# Patient Record
Sex: Male | Born: 1955 | Race: White | Hispanic: No | Marital: Married | Smoking: Never smoker
Health system: Southern US, Community
[De-identification: ages and names within clinical notes are randomized; demographics above are authoritative.]

## PROBLEM LIST (undated history)

## (undated) DIAGNOSIS — E7800 Pure hypercholesterolemia, unspecified: Secondary | ICD-10-CM

## (undated) DIAGNOSIS — Z9889 Other specified postprocedural states: Secondary | ICD-10-CM

## (undated) DIAGNOSIS — R112 Nausea with vomiting, unspecified: Secondary | ICD-10-CM

## (undated) HISTORY — PX: HERNIA REPAIR: SHX51

## (undated) HISTORY — PX: HYDROCELE EXCISION / REPAIR: SUR1145

---

## 1999-02-12 ENCOUNTER — Ambulatory Visit (HOSPITAL_BASED_OUTPATIENT_CLINIC_OR_DEPARTMENT_OTHER): Admission: RE | Admit: 1999-02-12 | Discharge: 1999-02-12 | Payer: Self-pay | Admitting: Urology

## 2004-02-03 ENCOUNTER — Ambulatory Visit (HOSPITAL_COMMUNITY): Admission: RE | Admit: 2004-02-03 | Discharge: 2004-02-03 | Payer: Self-pay | Admitting: Gastroenterology

## 2005-03-21 ENCOUNTER — Ambulatory Visit: Payer: Self-pay | Admitting: Family Medicine

## 2005-07-14 ENCOUNTER — Ambulatory Visit: Payer: Self-pay | Admitting: Family Medicine

## 2005-08-03 ENCOUNTER — Ambulatory Visit: Payer: Self-pay | Admitting: Family Medicine

## 2007-01-02 ENCOUNTER — Ambulatory Visit: Payer: Self-pay | Admitting: Family Medicine

## 2008-02-10 ENCOUNTER — Emergency Department (HOSPITAL_COMMUNITY): Admission: EM | Admit: 2008-02-10 | Discharge: 2008-02-10 | Payer: Self-pay | Admitting: Emergency Medicine

## 2008-02-10 IMAGING — CR DG FOOT COMPLETE 3+V*R*
3 series · 3 of 3 positions shown · non-contrast
Comparison: None available

CLINICAL DATA: *Trauma (Blunt);

RIGHT FOOT COMPLETE - 3+ VIEW

[view not recorded (1 of 3)]
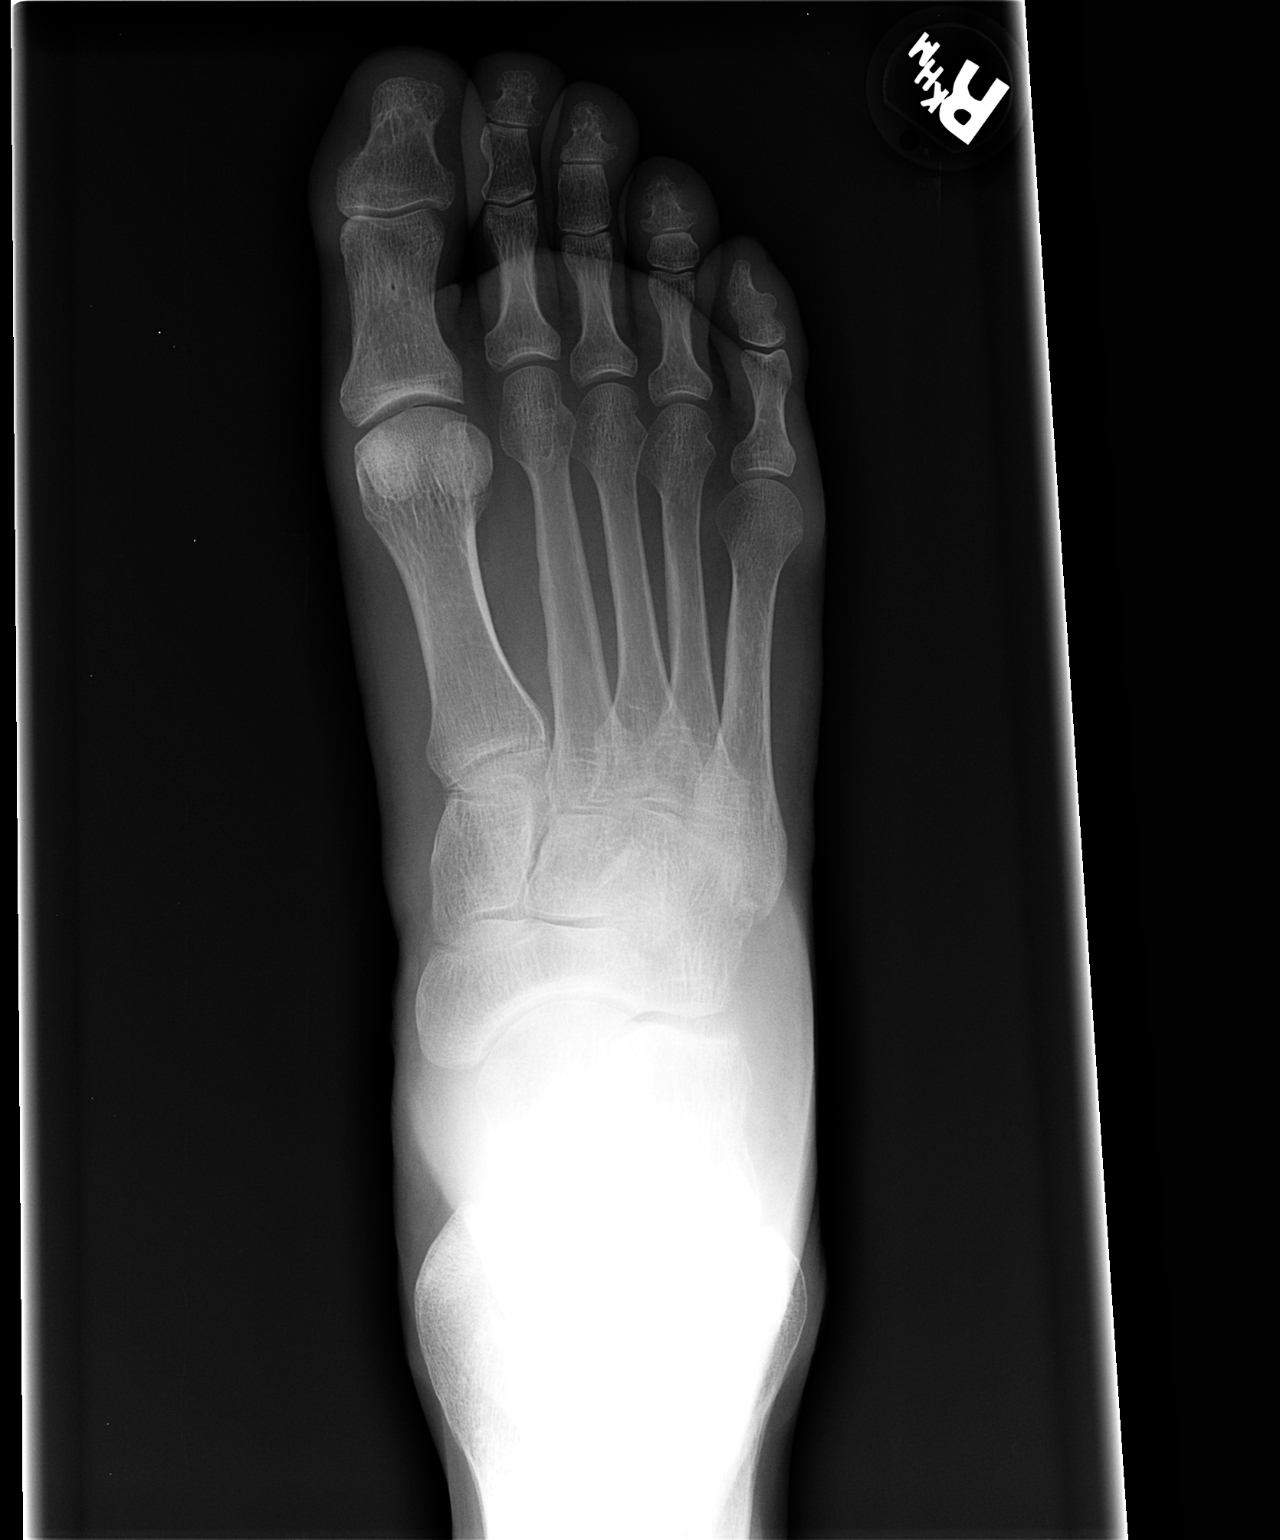

[view not recorded (2 of 3)]
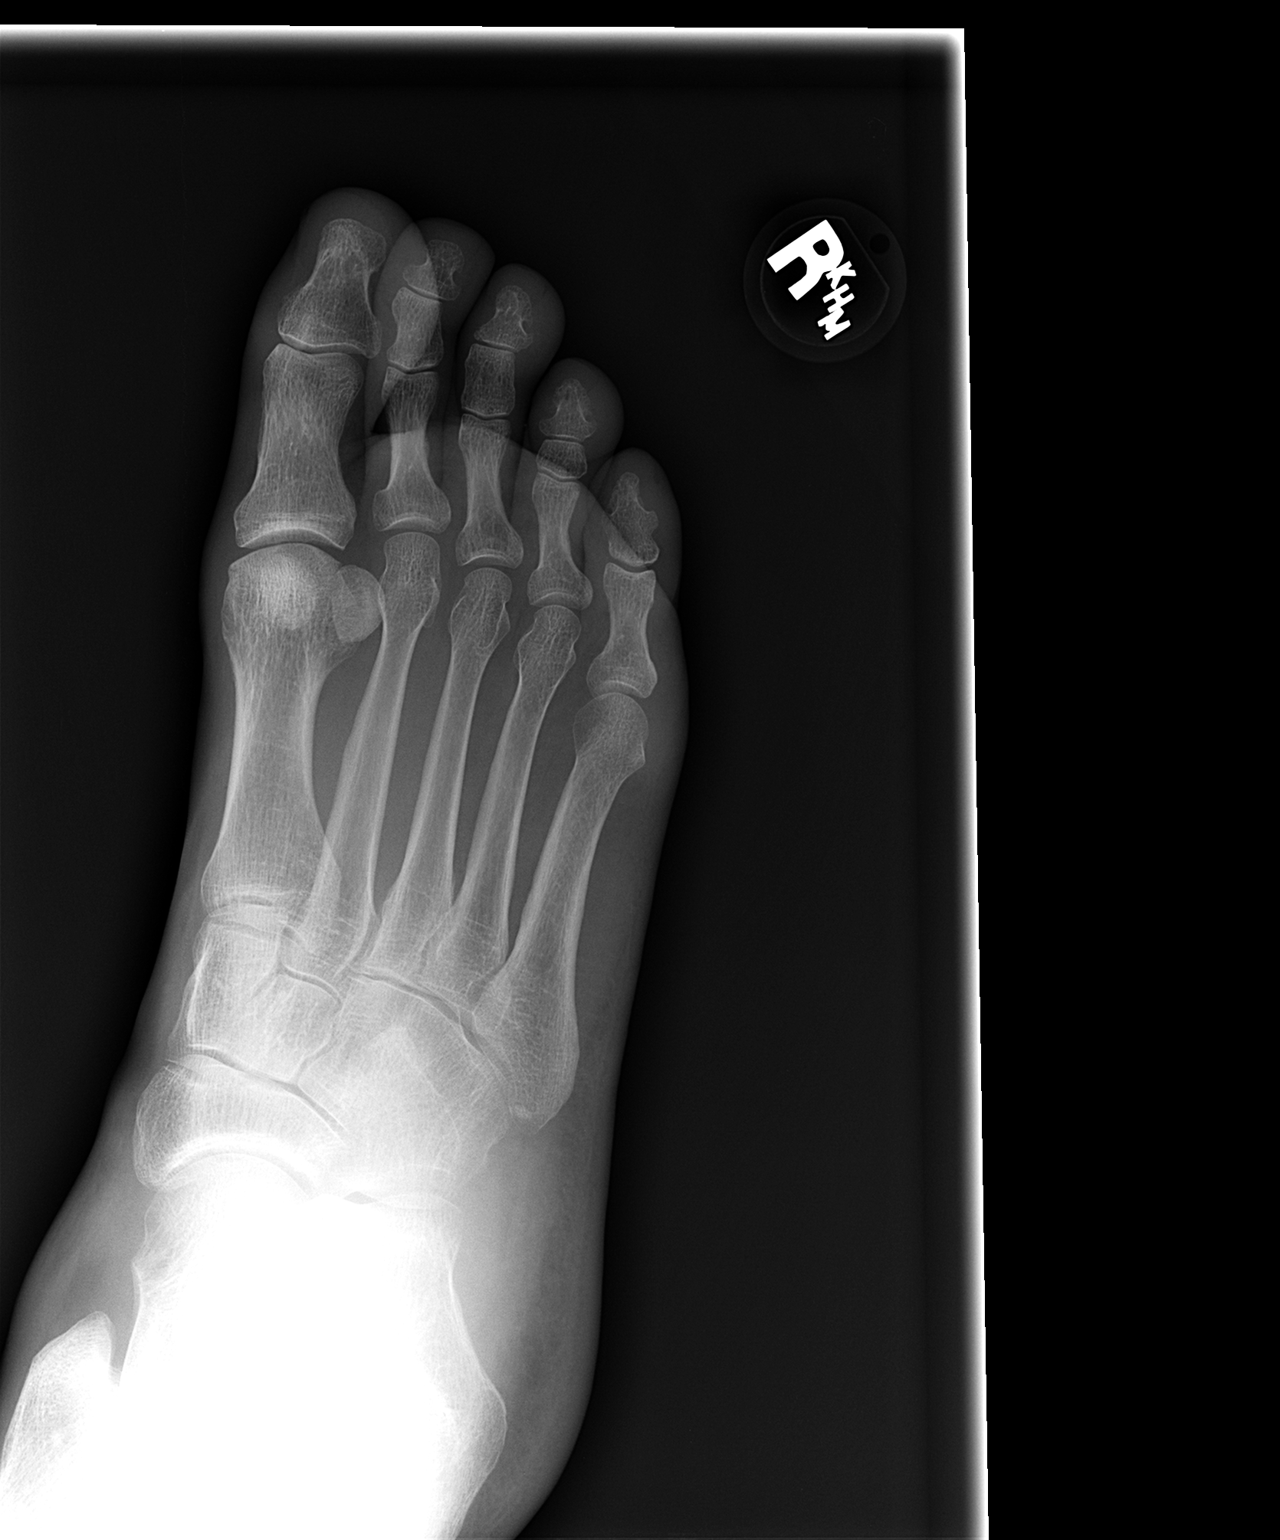

[view not recorded (3 of 3)]
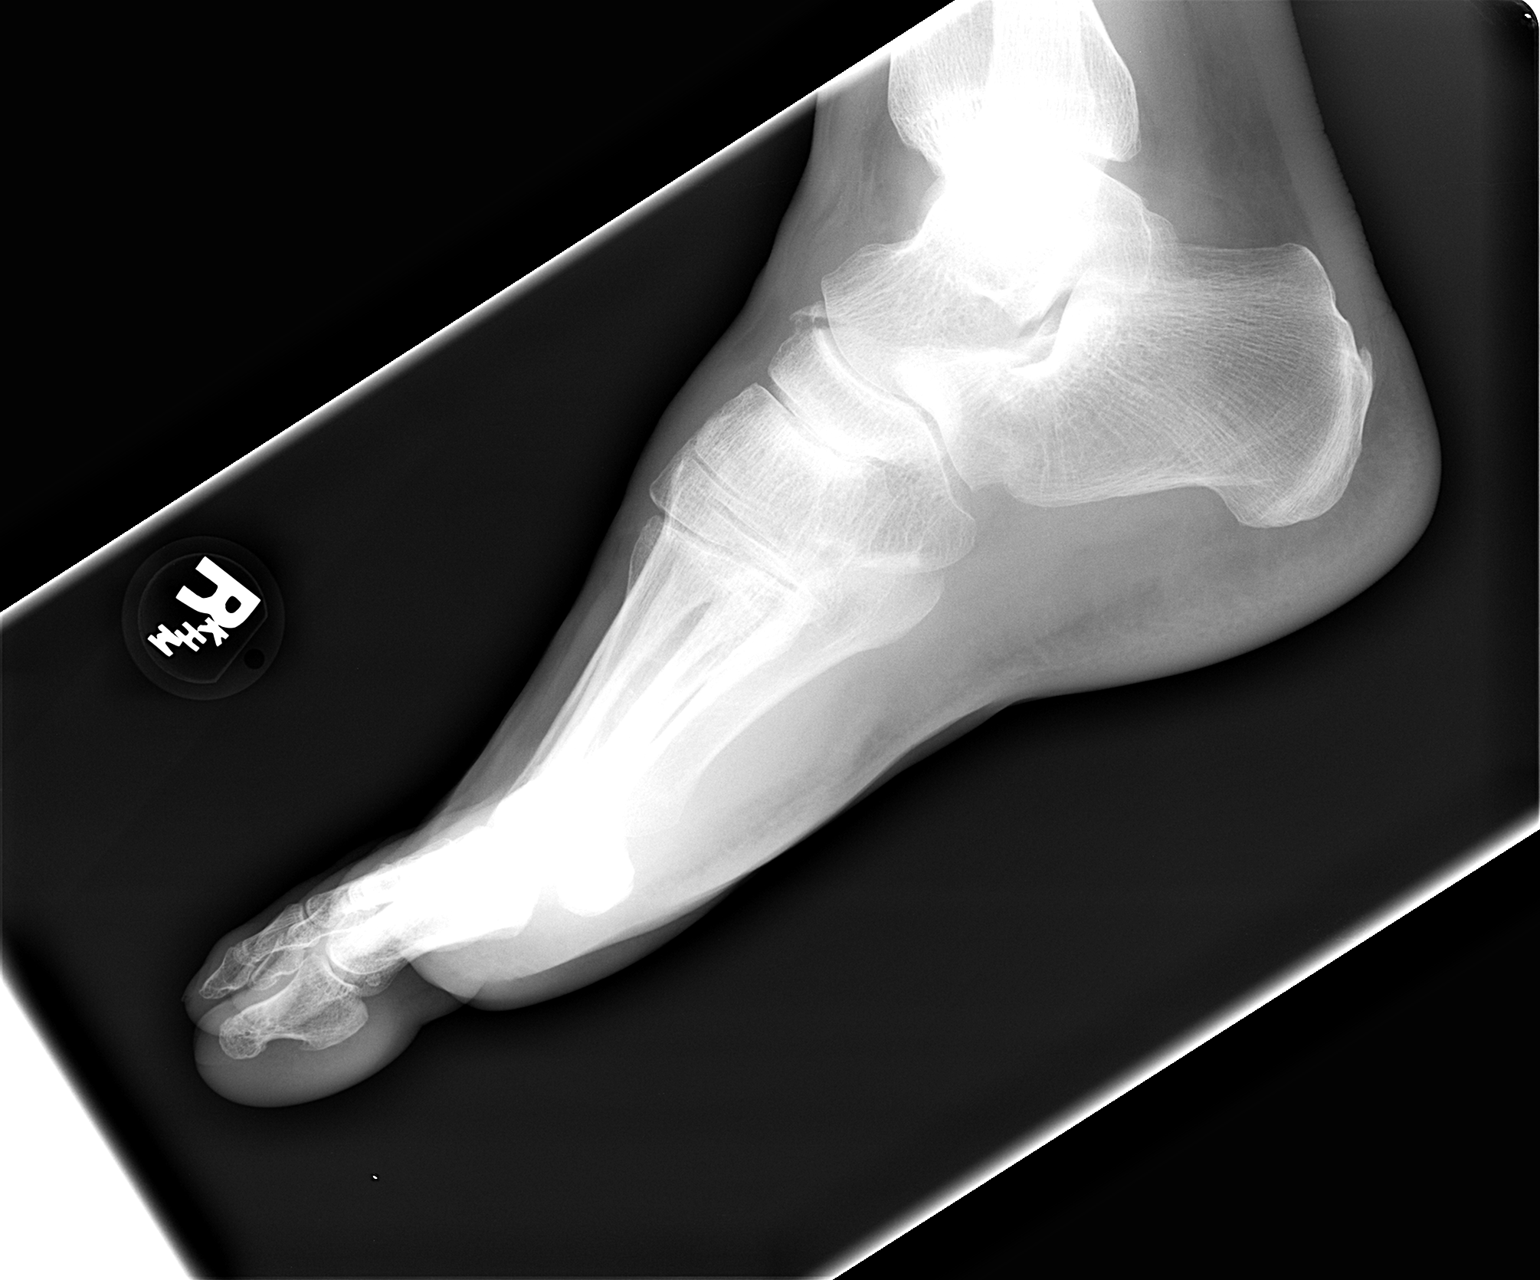

[3 of 3 positions shown; findings below may reference images not displayed]

FINDINGS: Small calcaneal spur.  There is fracture of the dorsal
margin of the navicular, small fracture fragment 2-3 mm.  Normal
alignment and mineralization.
IMPRESSION: 1.  Minimal displaced fracture, dorsal aspect navicular

## 2011-04-08 NOTE — Op Note (Signed)
NAME:  Jeremy Murphy, Jeremy Murphy                          ACCOUNT NO.:  [id number]   MEDICAL RECORD NO.:  [id number]                   PATIENT TYPE:  AMB   LOCATION:  ENDO                                 FACILITY:  [facility]   PHYSICIAN:  James L. Malon Kindle., M.D.          DATE OF BIRTH:  18-Apr-1956   DATE OF PROCEDURE:  02/03/2004  DATE OF DISCHARGE:                                 OPERATIVE REPORT   PROCEDURE PERFORMED:  Esophagogastroduodenoscopy with Savary dilatation.   MEDICATIONS:  Fentanyl 75 mcg, Versed 5 mg IV.   ENDOSCOPIST:  Llana Aliment. Randa Evens, M.D.   INDICATIONS FOR PROCEDURE:  Dysphagia due to Schatzki's ring.   DESCRIPTION OF PROCEDURE:  The procedure had been explained to the patient  in detail in the office and consent obtained.  In the endoscopy unit with  the patient in left lateral decubitus position and sedated, the endoscope  was passed.  The portable fluoroscopy unit was used.  The esophagus was  entered.  There was, in fact, stricture or ring in the distal esophagus we  easily passed.  There was Murphy small hiatal hernia.  Complete endoscopy was  performed.  The gastric outlet was identified and passed.  The duodenum  including the bulb and second portion were seen well and were unremarkable.  The scope was withdrawn back into the stomach. The pyloric channel was  normal.  The antrum and body were normal.  Fundus and cardia seen well on  retroflex view and were normal.  We then passed Murphy Savary guidewire through  the scope and using fluoroscopic guidance withdrew the scope over the  guidewire taking care to keep the guidewire along the greater curve of the  stomach.  With the patient's neck extended, we passed Murphy 42, 45 and 48 French  dilator.  There was no heme with the 42 and 45 [place] dilators, Murphy small  amount of heme with Murphy 48 [place] dilator.  The dilator and guidewire were  then withdrawn.  The patient tolerated the procedure well and there were no  immediate complications.   He was monitored and given supplemental oxygen.   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   PLAN:  Routine postdilation instructions with clear liquids for four hours.  Call for shortness of breath, pain, bleeding, etc with gradual return to Murphy  regular diet.  The patient will come back and see [place] as needed and if needed  we can arrange further dilatation.                                               James L. Malon Kindle., M.D.    Waldron Session  D:  02/03/2004  T:  02/04/2004  Job:  [id number]   cc:   Delaney Meigs, M.D.  [address]  [place]  [place] [address]  Fax: [phone]

## 2012-03-07 ENCOUNTER — Encounter (HOSPITAL_COMMUNITY): Payer: Self-pay | Admitting: *Deleted

## 2012-03-07 ENCOUNTER — Ambulatory Visit (HOSPITAL_COMMUNITY): Payer: PRIVATE HEALTH INSURANCE

## 2012-03-07 ENCOUNTER — Encounter (HOSPITAL_COMMUNITY)
Admission: RE | Disposition: A | Discharge status: Home / Self Care | Payer: Self-pay | Source: Ambulatory Visit | Attending: Gastroenterology

## 2012-03-07 ENCOUNTER — Ambulatory Visit (HOSPITAL_COMMUNITY)
Admission: RE | Admit: 2012-03-07 | Discharge: 2012-03-07 | Disposition: A | Discharge status: Home / Self Care | Payer: PRIVATE HEALTH INSURANCE | Source: Ambulatory Visit | Attending: Gastroenterology | Admitting: Gastroenterology

## 2012-03-07 DIAGNOSIS — K222 Esophageal obstruction: Secondary | ICD-10-CM | POA: Insufficient documentation

## 2012-03-07 HISTORY — DX: Pure hypercholesterolemia, unspecified: E78.00

## 2012-03-07 HISTORY — PX: SAVORY DILATION: SHX5439

## 2012-03-07 SURGERY — ESOPHAGOGASTRODUODENOSCOPY (EGD) WITH ESOPHAGEAL DILATION
Anesthesia: Moderate Sedation

## 2012-03-07 MED ORDER — SODIUM CHLORIDE 0.9 % IV SOLN
Freq: Once | INTRAVENOUS | Status: AC
  Administered 2012-03-07: 11:00:00 via INTRAVENOUS

## 2012-03-07 MED ORDER — MIDAZOLAM HCL 10 MG/2ML IJ SOLN
INTRAMUSCULAR | Status: AC
  Filled 2012-03-07: qty 4

## 2012-03-07 MED ORDER — MIDAZOLAM HCL 10 MG/2ML IJ SOLN
INTRAMUSCULAR | Status: DC | PRN
  Administered 2012-03-07 (×4): 2 mg via INTRAVENOUS

## 2012-03-07 MED ORDER — FENTANYL CITRATE 0.05 MG/ML IJ SOLN
INTRAMUSCULAR | Status: AC
  Filled 2012-03-07: qty 4

## 2012-03-07 MED ORDER — FENTANYL CITRATE 0.05 MG/ML IJ SOLN
INTRAMUSCULAR | Status: DC | PRN
  Administered 2012-03-07 (×3): 25 ug via INTRAVENOUS

## 2012-03-07 MED ORDER — BUTAMBEN-TETRACAINE-BENZOCAINE 2-2-14 % EX AERO
INHALATION_SPRAY | CUTANEOUS | Status: DC | PRN
  Administered 2012-03-07: 2 via TOPICAL

## 2012-03-07 MED ORDER — DIPHENHYDRAMINE HCL 50 MG/ML IJ SOLN
INTRAMUSCULAR | Status: AC
  Filled 2012-03-07: qty 1

## 2012-03-07 NOTE — Discharge Instructions (Addendum)
Continue current meds.  Clear liquids for 4-6 hours, if no chest pain or trouble breathing, soft foods tonight.  Regular diet tomorrow. Call for problems. Continue to take OTC omeprazole. Call Dr Randa Evens office with swallowing report next week.  Endoscopy with Esophageal Dilitation Care After Please read the instructions outlined below and refer to this sheet in the next few weeks. These discharge instructions provide you with general information on caring for yourself after you leave the hospital. Your doctor may also give you specific instructions. While your treatment has been planned according to the most current medical practices available, unavoidable complications occasionally occur. If you have any problems or questions after discharge, please call your doctor.  HOME CARE INSTRUCTIONS Activity  You may resume your regular activity but move at a slower pace for the next 24 hours.   Take frequent rest periods for the next 24 hours.   Walking will help expel (get rid of) the air and reduce the bloated feeling in your abdomen.   No driving for 24 hours (because of the anesthesia (medicine) used during the test).   You may shower.   Do not sign any important legal documents or operate any machinery for 24 hours (because of the anesthesia used during the test).    Nutrition  Clear Liquids for 4-6 hours today,then if no swallowing difficulty you may have soft foods tonight.   Regular diet tomorrow.  Avoid alcoholic beverages for 24 hours.   Medications You may resume your normal medications unless your caregiver tells you otherwise.  What you can expect today  You may experience abdominal discomfort such as a feeling of fullness or "gas" pains.   You may experience a sore throat for 2 to 3 days. This is normal. Gargling with salt water may help this.   SEEK IMMEDIATE MEDICAL CARE IF:  You have excessive nausea (feeling sick to your stomach) and/or vomiting.   You have  severe abdominal pain and distention (swelling).   You have trouble swallowing.   You have a temperature over 100 F (37.8 C).   You have rectal bleeding or vomiting of blood.   Difficulty swallowing that is worse than normal.

## 2012-03-07 NOTE — Op Note (Signed)
[facility] [address] [place], [place]  [address]  ENDOSCOPY PROCEDURE REPORT  PATIENT:  Jeremy Murphy, Jeremy Murphy  MR#:  [id number] BIRTHDATE:  Apr 05, 1956, 55 yrs. old  GENDER:  male  ENDOSCOPIST:  Carman Ching ASSISTANT:  Angelique Blonder and Windell Hummingbird, Technician Referred by:  Joette Catching, M.D.  PROCEDURE DATE:  03/07/2012 PROCEDURE:  Esophagoscopy with Savary Dilation of Esophagus ASA CLASS:  Class I INDICATIONS:  1) dysphagia  2) stricture  MEDICATIONS:   Fentanyl 50 mcg IV, Versed 8 mg IV TOPICAL ANESTHETIC:  Cetacaine Spray  DESCRIPTION OF PROCEDURE:   After the risks benefits and alternatives of the procedure were thoroughly explained, informed consent was obtained.  The EG-[id number] ([id number]) endoscope was introduced through the mouth and advanced to the gastroesophageal junction, limited by a stricture. unable to pass scope with gentle pressure  The instrument was slowly withdrawn as the mucosa was carefully examined. <<PROCEDUREIMAGES>>  A stricture was found in the distal esophagus.    Dilation was then performed at the gastroesphageal junction  1) Dilator:  Savary over guidewire  Size(s): Resistance:  minimal  Heme:  yes Appearance: Past 12.8, 14, and 15 mm dilators using fluoroscopic guidance. Heme seen with a 15 mm dilator only. Minimal resistance.  COMPLICATIONS:  A complication of none occurred on 03/07/2012 at. no immediate complications  ENDOSCOPIC IMPRESSION: 1) Stricture in the distal esophagus dilated to 15 mm RECOMMENDATIONS: routine post dilatation orders. We will keep him on same medications. We will have him call us to report next week and do further dilatation if needed.  REPEAT EXAM:  No  ______________________________ Carman Ching  CC:  Joette Catching, MD  n. Rosalie DoctorCarman Ching at 03/07/2012 11:43 AM  Melvia Heaps, [phone]

## 2012-03-07 NOTE — H&P (Signed)
  H+P:  Patient with history of stricture with increasing dysphagia coming in for elective dilatation. H&P from the office reviewed and updated and scanned  Medications/Allergies:  See nurses notes and scanned H+P  PE: See pre-op evaluation

## 2012-03-08 ENCOUNTER — Encounter (HOSPITAL_COMMUNITY): Payer: Self-pay | Admitting: Gastroenterology

## 2013-12-04 DIAGNOSIS — C4492 Squamous cell carcinoma of skin, unspecified: Secondary | ICD-10-CM

## 2013-12-04 HISTORY — DX: Squamous cell carcinoma of skin, unspecified: C44.92

## 2014-03-24 DIAGNOSIS — K219 Gastro-esophageal reflux disease without esophagitis: Secondary | ICD-10-CM | POA: Insufficient documentation

## 2014-03-24 DIAGNOSIS — E782 Mixed hyperlipidemia: Secondary | ICD-10-CM | POA: Insufficient documentation

## 2018-06-12 ENCOUNTER — Other Ambulatory Visit: Payer: Self-pay | Admitting: Gastroenterology

## 2018-06-19 ENCOUNTER — Encounter (HOSPITAL_COMMUNITY): Payer: Self-pay | Admitting: Anesthesiology

## 2018-06-19 NOTE — Anesthesia Preprocedure Evaluation (Addendum)
Anesthesia Evaluation  Patient identified by MRN, date of birth, ID band Patient awake    Reviewed: Allergy & Precautions, H&P , NPO status , Patient's Chart, lab work & pertinent test results  History of Anesthesia Complications (+) PONV and history of anesthetic complications  Airway Mallampati: I  TM Distance: >3 FB Neck ROM: full    Dental no notable dental hx. (+) Teeth Intact   Pulmonary neg pulmonary ROS,    breath sounds clear to auscultation       Cardiovascular negative cardio ROS Normal cardiovascular exam Rhythm:regular Rate:Normal     Neuro/Psych negative neurological ROS  negative psych ROS   GI/Hepatic negative GI ROS, Neg liver ROS,   Endo/Other  negative endocrine ROS  Renal/GU negative Renal ROS  negative genitourinary   Musculoskeletal negative musculoskeletal ROS (+)   Abdominal Normal abdominal exam  (+)   Peds  Hematology negative hematology ROS (+)   Anesthesia Other Findings   Reproductive/Obstetrics negative OB ROS                            Anesthesia Physical Anesthesia Plan  ASA: II  Anesthesia Plan: MAC   Post-op Pain Management:    Induction:   PONV Risk Score and Plan: 2 and Dexamethasone and Scopolamine patch - Pre-op  Airway Management Planned: Natural Airway and Mask  Additional Equipment:   Intra-op Plan:   Post-operative Plan:   Informed Consent: I have reviewed the patients History and Physical, chart, labs and discussed the procedure including the risks, benefits and alternatives for the proposed anesthesia with the patient or authorized representative who has indicated his/her understanding and acceptance.   Dental Advisory Given  Plan Discussed with: CRNA  Anesthesia Plan Comments:        Anesthesia Quick Evaluation

## 2018-06-20 ENCOUNTER — Ambulatory Visit (HOSPITAL_COMMUNITY): Payer: BLUE CROSS/BLUE SHIELD

## 2018-06-20 ENCOUNTER — Encounter (HOSPITAL_COMMUNITY)
Admission: RE | Disposition: A | Discharge status: Home / Self Care | Payer: Self-pay | Source: Ambulatory Visit | Attending: Gastroenterology

## 2018-06-20 ENCOUNTER — Ambulatory Visit (HOSPITAL_COMMUNITY): Payer: BLUE CROSS/BLUE SHIELD | Admitting: Anesthesiology

## 2018-06-20 ENCOUNTER — Other Ambulatory Visit: Payer: Self-pay

## 2018-06-20 ENCOUNTER — Encounter (HOSPITAL_COMMUNITY): Payer: Self-pay | Admitting: Anesthesiology

## 2018-06-20 ENCOUNTER — Ambulatory Visit (HOSPITAL_COMMUNITY)
Admission: RE | Admit: 2018-06-20 | Discharge: 2018-06-20 | Disposition: A | Discharge status: Home / Self Care | Payer: BLUE CROSS/BLUE SHIELD | Source: Ambulatory Visit | Attending: Gastroenterology | Admitting: Gastroenterology

## 2018-06-20 DIAGNOSIS — R1310 Dysphagia, unspecified: Secondary | ICD-10-CM | POA: Insufficient documentation

## 2018-06-20 DIAGNOSIS — K222 Esophageal obstruction: Secondary | ICD-10-CM | POA: Insufficient documentation

## 2018-06-20 DIAGNOSIS — Z9889 Other specified postprocedural states: Secondary | ICD-10-CM | POA: Insufficient documentation

## 2018-06-20 DIAGNOSIS — Z7982 Long term (current) use of aspirin: Secondary | ICD-10-CM | POA: Diagnosis not present

## 2018-06-20 HISTORY — DX: Other specified postprocedural states: Z98.890

## 2018-06-20 HISTORY — PX: ESOPHAGOGASTRODUODENOSCOPY (EGD) WITH PROPOFOL: SHX5813

## 2018-06-20 HISTORY — DX: Nausea with vomiting, unspecified: R11.2

## 2018-06-20 HISTORY — PX: SAVORY DILATION: SHX5439

## 2018-06-20 SURGERY — ESOPHAGOGASTRODUODENOSCOPY (EGD) WITH PROPOFOL
Anesthesia: Monitor Anesthesia Care

## 2018-06-20 MED ORDER — LACTATED RINGERS IV SOLN
INTRAVENOUS | Status: DC
  Administered 2018-06-20: 1000 mL via INTRAVENOUS

## 2018-06-20 MED ORDER — SCOPOLAMINE 1 MG/3DAYS TD PT72
MEDICATED_PATCH | TRANSDERMAL | Status: DC | PRN
  Administered 2018-06-20: 1 via TRANSDERMAL

## 2018-06-20 MED ORDER — ONDANSETRON HCL 4 MG/2ML IJ SOLN
INTRAMUSCULAR | Status: DC | PRN
  Administered 2018-06-20: 4 mg via INTRAVENOUS

## 2018-06-20 MED ORDER — SODIUM CHLORIDE 0.9 % IV SOLN: INTRAVENOUS | Status: DC

## 2018-06-20 MED ORDER — MIDAZOLAM HCL 5 MG/5ML IJ SOLN
INTRAMUSCULAR | Status: DC | PRN
  Administered 2018-06-20: 2 mg via INTRAVENOUS

## 2018-06-20 MED ORDER — SCOPOLAMINE 1 MG/3DAYS TD PT72
MEDICATED_PATCH | TRANSDERMAL | Status: AC
  Filled 2018-06-20: qty 1

## 2018-06-20 MED ORDER — PROPOFOL 500 MG/50ML IV EMUL
INTRAVENOUS | Status: DC | PRN
  Administered 2018-06-20: 125 ug/kg/min via INTRAVENOUS

## 2018-06-20 MED ORDER — DEXAMETHASONE SODIUM PHOSPHATE 10 MG/ML IJ SOLN
INTRAMUSCULAR | Status: DC | PRN
  Administered 2018-06-20: 10 mg via INTRAVENOUS

## 2018-06-20 MED ORDER — PROPOFOL 500 MG/50ML IV EMUL
INTRAVENOUS | Status: DC | PRN
  Administered 2018-06-20: 50 mg via INTRAVENOUS

## 2018-06-20 MED ORDER — MIDAZOLAM HCL 2 MG/2ML IJ SOLN
INTRAMUSCULAR | Status: AC
  Filled 2018-06-20: qty 2

## 2018-06-20 SURGICAL SUPPLY — 15 items

## 2018-06-20 NOTE — Discharge Instructions (Signed)
YOU HAD AN ENDOSCOPIC PROCEDURE TODAY: Refer to the procedure report and other information in the discharge instructions given to you for any specific questions about what was found during the examination. If this information does not answer your questions, please call [facility] office at [phone] to clarify.  ° °YOU SHOULD EXPECT: Some feelings of bloating in the abdomen. Passage of more gas than usual. Walking can help get rid of the air that was put into your GI tract during the procedure and reduce the bloating.  Some abdominal soreness may be present for a day or two, also. ° °DIET: Your first meal following the procedure should be a light meal and then it is ok to progress to your normal diet. A half-sandwich or bowl of soup is an example of a good first meal. Heavy or fried foods are harder to digest and may make you feel nauseous or bloated. Drink plenty of fluids but you should avoid alcoholic beverages for 24 hours. If you had a esophageal dilation, please see attached instructions for diet.  ° °ACTIVITY: Your care partner should take you home directly after the procedure. You should plan to take it easy, moving slowly for the rest of the day. You can resume normal activity the day after the procedure however YOU SHOULD NOT DRIVE, use power tools, machinery or perform tasks that involve climbing or major physical exertion for 24 hours (because of the sedation medicines used during the test).  ° °SYMPTOMS TO REPORT IMMEDIATELY: °A gastroenterologist can be reached at any hour. Please call [phone]  for any of the following symptoms:  ° °Following upper endoscopy (EGD, EUS, ERCP, esophageal dilation) °Vomiting of blood or coffee ground material  °New, significant abdominal pain  °New, significant chest pain or pain under the shoulder blades  °Painful or persistently difficult swallowing  °New shortness of breath  °Black, tarry-looking or red, bloody stools ° °FOLLOW UP:  °If any biopsies were taken  you will be contacted by phone or by letter within the next 1-3 weeks. Call [phone]  if you have not heard about the biopsies in 3 weeks.  °Please also call with any specific questions about appointments or follow up tests. ° °

## 2018-06-20 NOTE — Transfer of Care (Signed)
Immediate Anesthesia Transfer of Care Note  Patient: Jeremy Murphy  Procedure(s) Performed: Procedure(s): ESOPHAGOGASTRODUODENOSCOPY (EGD) WITH PROPOFOL (N/A) SAVORY DILATION (N/A)  Patient Location: PACU  Anesthesia Type:MAC  Level of Consciousness:  sedated, patient cooperative and responds to stimulation  Airway & Oxygen Therapy:Patient Spontanous Breathing and Patient connected to face mask oxgen  Post-op Assessment:  Report given to PACU RN and Post -op Vital signs reviewed and stable  Post vital signs:  Reviewed and stable  Last Vitals:  Vitals:   06/20/18 0825 06/20/18 0923  BP: (!) 164/73 (!) 148/79  Pulse:  79  Resp: (!) 9 12  Temp: 36.7 C   SpO2: 778% 24%    Complications: No apparent anesthesia complications

## 2018-06-20 NOTE — Op Note (Signed)
[facility] Patient Name: Jeremy Murphy Procedure Date: 06/20/2018 MRN: [id number] Attending MD: Nancy Fetter Dr., MD Date of Birth: 03/29/1956 CSN: [id number] Age: 62 Admit Type: Inpatient Procedure:                Upper GI endoscopy with Savary dilatation Indications:              Dysphagia Providers:                Jeneen Rinks L. Nakira Litzau Dr., MD, Burtis Junes, RN, Alan Mulder, Technician, Arnoldo Hooker, CRNA Referring MD:              Medicines:                Monitored Anesthesia Care Complications:            No immediate complications. Estimated Blood Loss:     Estimated blood loss: none. Procedure:                Pre-Anesthesia Assessment:                           - Prior to the procedure, a History and Physical                            was performed, and patient medications and                            allergies were reviewed. The patient's tolerance of                            previous anesthesia was also reviewed. The risks                            and benefits of the procedure and the sedation                            options and risks were discussed with the patient.                            All questions were answered, and informed consent                            was obtained. Prior Anticoagulants: The patient has                            taken aspirin, last dose was 7 days prior to                            procedure. ASA Grade Assessment: II - A patient                            with mild systemic disease. After reviewing the  risks and benefits, the patient was deemed in                            satisfactory condition to undergo the procedure.                           After obtaining informed consent, the endoscope was                            passed under direct vision. Throughout the                            procedure, the patient's blood pressure, pulse, and       oxygen saturations were monitored continuously. The                            [id number] ([id number]) Olympus adult endoscope was                            introduced through the mouth, and advanced to the                            second part of duodenum. The upper GI endoscopy was                            accomplished without difficulty. The patient                            tolerated the procedure well. Scope In: Scope Out: Findings:      One benign-appearing, intrinsic mild stenosis was found at the       gastroesophageal junction. The stenosis was traversed. A guidewire was       placed under fluoroscopic guidance and the scope was withdrawn. Dilation       was performed with a Savary dilator with no resistance at 14 mm, 15 mm       and 16 mm. Estimated blood loss: none. No blood visible in many of the       dilators and the 14mm dilator and guide wire were removed. 55 seconds       Fluor time was used.      The stomach was normal.      The examined duodenum was normal. Impression:               - Benign-appearing esophageal stenosis. Dilated to                            16 mm                           - Normal stomach.                           - Normal examined duodenum.                           - No specimens collected. Moderate Sedation:  See anesthesia note, no moderate sedation. Recommendation:           - Patient has a contact number available for                            emergencies. The signs and symptoms of potential                            delayed complications were discussed with the                            patient. Return to normal activities tomorrow.                            Written discharge instructions were provided to the                            patient.                           - Clear liquid diet for 4 hours.                           - Mechanical soft diet for 1 day.                           - Continue present medications.                            - Repeat upper endoscopy PRN. Procedure Code(s):        --- Professional ---                           [id number], Esophagogastroduodenoscopy, flexible,                            transoral; with insertion of guide wire followed by                            passage of dilator(s) through esophagus over guide                            wire Diagnosis Code(s):        --- Professional ---                           K22.2, Esophageal obstruction                           R13.10, Dysphagia, unspecified CPT copyright 2017 American Medical Association. All rights reserved. The codes documented in this report are preliminary and upon coder review may  be revised to meet current compliance requirements. Nancy Fetter Dr., MD 06/20/2018 9:23:49 AM This report has been signed electronically. Number of Addenda: 0

## 2018-06-20 NOTE — Anesthesia Postprocedure Evaluation (Signed)
Anesthesia Post Note  Patient: Jeremy Murphy  Procedure(s) Performed: ESOPHAGOGASTRODUODENOSCOPY (EGD) WITH PROPOFOL (N/A ) SAVORY DILATION (N/A )     Patient location during evaluation: Endoscopy Anesthesia Type: MAC Level of consciousness: awake and sedated Pain management: pain level controlled Vital Signs Assessment: post-procedure vital signs reviewed and stable Respiratory status: spontaneous breathing Cardiovascular status: stable Postop Assessment: no apparent nausea or vomiting Anesthetic complications: no    Last Vitals:  Vitals:   06/20/18 0825 06/20/18 0923  BP: (!) 164/73 (!) 148/79  Pulse:  79  Resp: (!) 9 12  Temp: 36.7 C 36.5 C  SpO2: 100% 99%    Last Pain:  Vitals:   06/20/18 0923  TempSrc: Oral  PainSc: 0-No pain   Pain Goal:                 Ardythe Klute JR,JOHN Zacari Stiff

## 2018-06-20 NOTE — H&P (Signed)
Subjective:   Patient is a 62 y.o. male presents with dysphagia. He has a history of esophageal strictures had dilatation in the past. He remains onchronic omeprazole. He has had some increasing symptoms and comes over for an elective dilatation.. Procedure including risks and benefits discussed in office.  There are no active problems to display for this patient.  Past Medical History:  Diagnosis Date  . Hypercholesteremia   . PONV (postoperative nausea and vomiting)     Past Surgical History:  Procedure Laterality Date  . HERNIA REPAIR    . HYDROCELE EXCISION / REPAIR    . SAVORY DILATION  03/07/2012   Procedure: SAVORY DILATION;  Surgeon: Winfield Cunas., MD;  Location: [facility];  Service: Endoscopy;  Laterality: N/A;    Medications Prior to Admission  Medication Sig Dispense Refill Last Dose  . acetaminophen (TYLENOL) 500 MG tablet Take 1,000 mg by mouth daily as needed for moderate pain or headache.   Past Week at Unknown time  . aspirin EC 81 MG tablet Take 81 mg by mouth at bedtime.    Past Week at Unknown time  . cetirizine (ZYRTEC) 10 MG tablet Take 10 mg by mouth daily as needed for allergies.   06/19/2018 at Unknown time  . Multiple Vitamin (MULTIVITAMIN) tablet Take 1 tablet by mouth daily.   06/19/2018 at Unknown time  . omeprazole (PRILOSEC) 20 MG capsule Take 20 mg by mouth daily.   06/19/2018 at Unknown time   No Known Allergies  Social History   Tobacco Use  . Smoking status: Never Smoker  . Smokeless tobacco: Never Used  Substance Use Topics  . Alcohol use: Yes    Alcohol/week: 1.2 oz    Types: 1 Glasses of wine, 1 Cans of beer per week    History reviewed. No pertinent family history.   Objective:   Patient Vitals for the past 8 hrs:  BP Temp Temp src Resp SpO2 Height Weight  06/20/18 0825 (!) 164/73 98 F (36.7 C) Oral (!) 9 100 % 6' (1.829 m) 79.4 kg (175 lb)   No intake/output data recorded. No intake/output data recorded.   See MD Preop  evaluation      Assessment:   1.esophageal stricture. Due to acid peptic disease with some increasing dysphagia  Plan:   Will proceed with Savary dilatation using fluoroscopic guidance. Patient has had before but we discussed the procedure again with his wife and daughter present including the risk of bleeding and perforation.

## 2020-03-16 ENCOUNTER — Encounter: Payer: Self-pay | Admitting: *Deleted

## 2020-03-17 ENCOUNTER — Ambulatory Visit: Payer: PRIVATE HEALTH INSURANCE | Admitting: Physician Assistant

## 2020-06-07 ENCOUNTER — Other Ambulatory Visit: Payer: Self-pay | Admitting: Physician Assistant

## 2020-06-10 ENCOUNTER — Other Ambulatory Visit: Payer: Self-pay | Admitting: Physician Assistant

## 2020-09-01 ENCOUNTER — Ambulatory Visit: Payer: PRIVATE HEALTH INSURANCE | Admitting: Dermatology

## 2020-09-28 ENCOUNTER — Other Ambulatory Visit: Payer: Self-pay | Admitting: Dermatology

## 2021-01-14 ENCOUNTER — Other Ambulatory Visit: Payer: Self-pay | Admitting: Physician Assistant

## 2021-04-01 ENCOUNTER — Ambulatory Visit: Payer: PRIVATE HEALTH INSURANCE | Admitting: Physician Assistant

## 2021-04-15 ENCOUNTER — Encounter: Payer: Self-pay | Admitting: Physician Assistant

## 2021-04-15 ENCOUNTER — Ambulatory Visit (INDEPENDENT_AMBULATORY_CARE_PROVIDER_SITE_OTHER): Payer: Medicare Other | Admitting: Physician Assistant

## 2021-04-15 ENCOUNTER — Other Ambulatory Visit: Payer: Self-pay

## 2021-04-15 DIAGNOSIS — Z1283 Encounter for screening for malignant neoplasm of skin: Secondary | ICD-10-CM | POA: Diagnosis not present

## 2021-04-15 DIAGNOSIS — Z808 Family history of malignant neoplasm of other organs or systems: Secondary | ICD-10-CM

## 2021-04-15 DIAGNOSIS — D1800 Hemangioma unspecified site: Secondary | ICD-10-CM

## 2021-04-15 DIAGNOSIS — L820 Inflamed seborrheic keratosis: Secondary | ICD-10-CM | POA: Diagnosis not present

## 2021-04-15 DIAGNOSIS — L570 Actinic keratosis: Secondary | ICD-10-CM | POA: Diagnosis not present

## 2021-04-15 DIAGNOSIS — L578 Other skin changes due to chronic exposure to nonionizing radiation: Secondary | ICD-10-CM | POA: Diagnosis not present

## 2021-04-15 DIAGNOSIS — L814 Other melanin hyperpigmentation: Secondary | ICD-10-CM

## 2021-04-15 NOTE — Progress Notes (Signed)
   Follow-Up Visit   Subjective  Jeremy Murphy is a 65 y.o. male who presents for the following: Annual Exam (No new concerns. Per charts reviewed personal history of non mole skin cancers but no history of melanoma.  Patient denies any family history of melanoma but odes have family history of non mole skin cancers. ).   The following portions of the chart were reviewed this encounter and updated as appropriate:      Objective  Well appearing patient in no apparent distress; mood and affect are within normal limits.  A full examination was performed including scalp, head, eyes, ears, nose, lips, neck, chest, axillae, abdomen, back, buttocks, bilateral upper extremities, bilateral lower extremities, hands, feet, fingers, toes, fingernails, and toenails. All findings within normal limits unless otherwise noted below.  Objective  Left Forehead (3), Mid Back, Mid Frontal Scalp, Right Breast (2), Right Buccal Cheek: Erythematous patches with gritty scale.  Objective  Right Abdomen (side) - Lower, Right Lower Leg - Anterior, Right Thigh - Anterior and chest (8): Erythematous stuck-on, crusty brown plaques.    Assessment & Plan  AK (actinic keratosis) (8) Right Breast (2); Mid Back; Mid Frontal Scalp; Right Buccal Cheek; Left Forehead (3)  Destruction of lesion - Left Forehead, Mid Back, Mid Frontal Scalp, Right Breast, Right Buccal Cheek Complexity: simple   Destruction method: cryotherapy   Informed consent: discussed and consent obtained   Timeout:  patient name, date of birth, surgical site, and procedure verified Lesion destroyed using liquid nitrogen: Yes   Cryotherapy cycles:  1 Outcome: patient tolerated procedure well with no complications   Post-procedure details: wound care instructions given    Inflamed seborrheic keratosis (10) Right Thigh - Anterior and chest (8); Right Lower Leg - Anterior; Right Abdomen (side) - Lower  Destruction of lesion - Right Abdomen (side) -  Lower, Right Lower Leg - Anterior, Right Thigh - Anterior and chest Complexity: simple   Destruction method: cryotherapy   Informed consent: discussed and consent obtained   Timeout:  patient name, date of birth, surgical site, and procedure verified Lesion destroyed using liquid nitrogen: Yes   Cryotherapy cycles:  1 Outcome: patient tolerated procedure well with no complications   Post-procedure details: wound care instructions given    Lentigines - Scattered tan macules - Discussed due to sun exposure - Benign, observe - Call for any changes  Hemangiomas - Red papules - Discussed benign nature - Observe - Call for any changes  Actinic Damage - diffuse scaly erythematous macules with underlying dyspigmentation - Recommend daily broad spectrum sunscreen SPF 30+ to sun-exposed areas, reapply every 2 hours as needed.  - Call for new or changing lesions.  Skin cancer screening performed today.    I, Brittnae Aschenbrenner, PA-C, have reviewed all documentation's for this visit.  The documentation on 04/15/21 for the exam, diagnosis, procedures and orders are all accurate and complete.

## 2021-05-24 ENCOUNTER — Other Ambulatory Visit: Payer: Self-pay | Admitting: Dermatology

## 2021-11-30 ENCOUNTER — Other Ambulatory Visit: Payer: Self-pay | Admitting: Dermatology

## 2022-01-04 ENCOUNTER — Ambulatory Visit: Payer: Medicare Other | Admitting: Physician Assistant

## 2022-01-04 ENCOUNTER — Encounter: Payer: Self-pay | Admitting: Physician Assistant

## 2022-01-04 ENCOUNTER — Other Ambulatory Visit: Payer: Self-pay

## 2022-01-04 DIAGNOSIS — L820 Inflamed seborrheic keratosis: Secondary | ICD-10-CM

## 2022-01-04 DIAGNOSIS — Z1283 Encounter for screening for malignant neoplasm of skin: Secondary | ICD-10-CM

## 2022-01-04 DIAGNOSIS — L570 Actinic keratosis: Secondary | ICD-10-CM

## 2022-01-04 DIAGNOSIS — Z85828 Personal history of other malignant neoplasm of skin: Secondary | ICD-10-CM

## 2022-01-23 ENCOUNTER — Encounter: Payer: Self-pay | Admitting: Physician Assistant

## 2022-01-28 NOTE — Progress Notes (Signed)
° °  Follow-Up Visit   Subjective  Jeremy Murphy is a 66 y.o. male who presents for the following: Annual Exam (New lesion on right neck- skin tag?, left inner eye x months- no itch no bleed, left lower leg x  months- no itch no bleed. No family history of melanoma or non mole skin cancer. Personal history of non mole skin cancer but no melanoma. ).   The following portions of the chart were reviewed this encounter and updated as appropriate:  Tobacco   Allergies   Meds   Problems   Med Hx   Surg Hx   Fam Hx       Objective  Well appearing patient in no apparent distress; mood and affect are within normal limits.  A full examination was performed including scalp, head, eyes, ears, nose, lips, neck, chest, axillae, abdomen, back, buttocks, bilateral upper extremities, bilateral lower extremities, hands, feet, fingers, toes, fingernails, and toenails. All findings within normal limits unless otherwise noted below.  Left Frontal Scalp (2), Left Posterior Neck, Mid Frontal Scalp, Neck - Anterior, Right Buccal Cheek, Right Frontal Scalp (2) Erythematous patches with gritty scale.  Right Root of Nose Stuck-on,crusted plaques on an erythematous base.    Assessment & Plan  AK (actinic keratosis) (8) Neck - Anterior; Left Frontal Scalp (2); Right Frontal Scalp (2); Mid Frontal Scalp; Right Buccal Cheek; Left Posterior Neck  Destruction of lesion - Left Frontal Scalp (2), Left Posterior Neck, Mid Frontal Scalp, Neck - Anterior, Right Buccal Cheek, Right Frontal Scalp Complexity: simple   Destruction method: cryotherapy   Informed consent: discussed and consent obtained   Timeout:  patient name, date of birth, surgical site, and procedure verified Lesion destroyed using liquid nitrogen: Yes   Cryotherapy cycles:  1 Outcome: patient tolerated procedure well with no complications   Post-procedure details: wound care instructions given    Inflamed seborrheic keratosis Right Root of  Nose  Destruction of lesion - Right Root of Nose Complexity: simple   Destruction method: cryotherapy   Informed consent: discussed and consent obtained   Timeout:  patient name, date of birth, surgical site, and procedure verified Lesion destroyed using liquid nitrogen: Yes   Cryotherapy cycles:  1 Outcome: patient tolerated procedure well with no complications   Post-procedure details: wound care instructions given      I, Tyshun Tuckerman, PA-C, have reviewed all documentation's for this visit.  The documentation on 01/28/22 for the exam, diagnosis, procedures and orders are all accurate and complete.

## 2022-03-05 ENCOUNTER — Other Ambulatory Visit: Payer: Self-pay | Admitting: Dermatology

## 2022-03-14 ENCOUNTER — Other Ambulatory Visit: Payer: Self-pay | Admitting: Dermatology

## 2022-04-05 ENCOUNTER — Ambulatory Visit: Payer: Medicare Other | Admitting: Physician Assistant

## 2022-04-19 ENCOUNTER — Ambulatory Visit: Payer: Medicare Other | Admitting: Physician Assistant

## 2022-07-03 ENCOUNTER — Other Ambulatory Visit: Payer: Self-pay | Admitting: Dermatology

## 2022-07-18 ENCOUNTER — Other Ambulatory Visit: Payer: Self-pay | Admitting: Dermatology

## 2022-07-19 NOTE — Telephone Encounter (Signed)
Refill approved.

## 2023-01-04 ENCOUNTER — Ambulatory Visit: Payer: Medicare Other | Admitting: Physician Assistant
